# Patient Record
Sex: Female | Born: 1970 | Race: White | Hispanic: No | Marital: Married | Smoking: Never smoker
Health system: Southern US, Community
[De-identification: ages and names within clinical notes are randomized; demographics above are authoritative.]

## PROBLEM LIST (undated history)

## (undated) DIAGNOSIS — F419 Anxiety disorder, unspecified: Secondary | ICD-10-CM

## (undated) DIAGNOSIS — E7800 Pure hypercholesterolemia, unspecified: Secondary | ICD-10-CM

## (undated) DIAGNOSIS — M419 Scoliosis, unspecified: Secondary | ICD-10-CM

## (undated) DIAGNOSIS — E039 Hypothyroidism, unspecified: Secondary | ICD-10-CM

## (undated) DIAGNOSIS — F32A Depression, unspecified: Secondary | ICD-10-CM

## (undated) DIAGNOSIS — F329 Major depressive disorder, single episode, unspecified: Secondary | ICD-10-CM

## (undated) DIAGNOSIS — M549 Dorsalgia, unspecified: Secondary | ICD-10-CM

## (undated) HISTORY — PX: ABDOMINAL HYSTERECTOMY: SHX81

---

## 2009-04-29 ENCOUNTER — Emergency Department (HOSPITAL_BASED_OUTPATIENT_CLINIC_OR_DEPARTMENT_OTHER): Admission: EM | Admit: 2009-04-29 | Discharge: 2009-04-29 | Payer: Self-pay | Admitting: Emergency Medicine

## 2009-10-18 ENCOUNTER — Emergency Department (HOSPITAL_BASED_OUTPATIENT_CLINIC_OR_DEPARTMENT_OTHER): Admission: EM | Admit: 2009-10-18 | Discharge: 2009-10-19 | Payer: Self-pay | Admitting: Emergency Medicine

## 2010-11-14 LAB — URINALYSIS, ROUTINE W REFLEX MICROSCOPIC
Leukocytes, UA: NEGATIVE
Specific Gravity, Urine: 1.024 (ref 1.005–1.030)
Urobilinogen, UA: 1 mg/dL (ref 0.0–1.0)

## 2010-11-14 LAB — URINE MICROSCOPIC-ADD ON

## 2012-05-30 ENCOUNTER — Encounter (HOSPITAL_BASED_OUTPATIENT_CLINIC_OR_DEPARTMENT_OTHER): Payer: Self-pay | Admitting: *Deleted

## 2012-05-30 ENCOUNTER — Emergency Department (HOSPITAL_BASED_OUTPATIENT_CLINIC_OR_DEPARTMENT_OTHER)
Admission: EM | Admit: 2012-05-30 | Discharge: 2012-05-30 | Disposition: A | Discharge status: Home / Self Care | Payer: BC Managed Care – PPO | Attending: Emergency Medicine | Admitting: Emergency Medicine

## 2012-05-30 DIAGNOSIS — M545 Low back pain, unspecified: Secondary | ICD-10-CM | POA: Insufficient documentation

## 2012-05-30 DIAGNOSIS — R110 Nausea: Secondary | ICD-10-CM | POA: Insufficient documentation

## 2012-05-30 DIAGNOSIS — E039 Hypothyroidism, unspecified: Secondary | ICD-10-CM | POA: Insufficient documentation

## 2012-05-30 DIAGNOSIS — M4180 Other forms of scoliosis, site unspecified: Secondary | ICD-10-CM | POA: Insufficient documentation

## 2012-05-30 DIAGNOSIS — E7800 Pure hypercholesterolemia, unspecified: Secondary | ICD-10-CM | POA: Insufficient documentation

## 2012-05-30 HISTORY — DX: Scoliosis, unspecified: M41.9

## 2012-05-30 HISTORY — DX: Dorsalgia, unspecified: M54.9

## 2012-05-30 HISTORY — DX: Hypothyroidism, unspecified: E03.9

## 2012-05-30 HISTORY — DX: Pure hypercholesterolemia, unspecified: E78.00

## 2012-05-30 MED ORDER — HYDROMORPHONE HCL PF 2 MG/ML IJ SOLN
2.0000 mg | Freq: Once | INTRAMUSCULAR | Status: DC
  Filled 2012-05-30: qty 1

## 2012-05-30 MED ORDER — ONDANSETRON 8 MG PO TBDP: 8.0000 mg | ORAL_TABLET | Freq: Three times a day (TID) | ORAL | Status: DC | PRN

## 2012-05-30 MED ORDER — METHYLPREDNISOLONE 4 MG PO KIT
PACK | ORAL | Status: DC
Start: 2012-05-30 — End: 2014-12-07

## 2012-05-30 MED ORDER — HYDROMORPHONE HCL 2 MG PO TABS: 2.0000 mg | ORAL_TABLET | ORAL | Status: DC | PRN

## 2012-05-30 MED ORDER — ONDANSETRON 8 MG PO TBDP
8.0000 mg | ORAL_TABLET | Freq: Once | ORAL | Status: AC
  Administered 2012-05-30: 8 mg via ORAL
  Filled 2012-05-30: qty 1

## 2012-05-30 MED ORDER — HYDROMORPHONE HCL PF 1 MG/ML IJ SOLN
2.0000 mg | Freq: Once | INTRAMUSCULAR | Status: AC
  Administered 2012-05-30: 2 mg via INTRAMUSCULAR

## 2012-05-30 NOTE — ED Notes (Signed)
Pt. C/o of chronic history of back pain. States she was working in the yard yesterday. C/o lower back pain right greater than left that started last night. Pt. States pain worse with movement. Denies any urinary incontinence.

## 2012-05-30 NOTE — ED Provider Notes (Signed)
History     CSN: [id number]  Arrival date & time 05/30/12  0451      Chief Complaint  Patient presents with  . Back Pain    (Consider location/radiation/quality/duration/timing/severity/associated sxs/prior treatment) HPI This is a 41 year old female with a history of low back pain for about the past 6 months. She's not been on any regular pain medications. She worked in her yard yesterday and thinks she "overdid it". She's developed severe pain in the lower mid lumbar region. The pain is worse with movement, improved with lying still. Certain positions are more painful than others. Pain is exacerbated also by movement of her legs at the hips, right greater than left. She denies any bowel or bladder changes. She denies saddle anesthesia. She denies numbness or tingling. She denies motor deficit. She states the pain is causing her to be nauseated.  Past Medical History  Diagnosis Date  . Hypothyroid   . Elevated cholesterol   . Back pain   . Scoliosis     History reviewed. No pertinent past surgical history.  No family history on file.  History  Substance Use Topics  . Smoking status: Never Smoker   . Smokeless tobacco: Not on file  . Alcohol Use: No    OB History    Grav Para Term Preterm Abortions TAB SAB Ect Mult Living                  Review of Systems  All other systems reviewed and are negative.    Allergies  Review of patient's allergies indicates no known allergies.  Home Medications   Current Outpatient Rx  Name Route Sig Dispense Refill  . AMITRIPTYLINE HCL 75 MG PO TABS Oral Take by mouth at bedtime.    Marland Kitchen PROZAC PO Oral Take by mouth.    Marland Kitchen LEVOTHYROXINE SODIUM 75 MCG PO TABS Oral Take 75 mcg by mouth daily.    Marland Kitchen PANTOPRAZOLE SODIUM 40 MG PO TBEC Oral Take 40 mg by mouth daily.    Marland Kitchen ROSUVASTATIN CALCIUM 5 MG PO TABS Oral Take 5 mg by mouth daily.      BP 138/87  Pulse 91  Temp 98.3 F (36.8 C) (Oral)  Resp 20  SpO2 100%  LMP  10/29/2011  Physical Exam General: Well-developed, well-nourished female in no acute distress; appearance consistent with age of record HENT: normocephalic, atraumatic Eyes: pupils equal round and reactive to light; extraocular muscles intact Neck: supple Heart: regular rate and rhythm Lungs: clear to auscultation bilaterally Abdomen: soft; nondistended; nontender Back: No lumbar tenderness; pain on flexion or extension of lower back; pain on straight leg raise 45 left, 35 right Extremities: No deformity; decreased range of motion at hips due to pain; pulses normal Neurologic: Awake, alert and oriented; motor function intact in all extremities and symmetric; no facial droop; sensation intact and symmetric; no saddle anesthesia Skin: Warm and dry     ED Course  Procedures (including critical care time)     MDM  6:05 AM Pain improved after IM Dilaudid. We'll treat with analgesics and a steroid taper. Patient has a primary care physician with whom she can followup.        Hanley Seamen, MD 05/30/12 [phone]

## 2012-05-30 NOTE — ED Notes (Signed)
Assisted to the car in a w/c. Tolerated well.

## 2012-06-01 NOTE — ED Notes (Signed)
Pt called requesting a return to work note be mailed to home. Val, NS will mail note.

## 2014-12-07 ENCOUNTER — Emergency Department (HOSPITAL_BASED_OUTPATIENT_CLINIC_OR_DEPARTMENT_OTHER)
Admission: EM | Admit: 2014-12-07 | Discharge: 2014-12-07 | Disposition: A | Discharge status: Home / Self Care | Payer: Medicaid Other | Attending: Emergency Medicine | Admitting: Emergency Medicine

## 2014-12-07 ENCOUNTER — Encounter (HOSPITAL_BASED_OUTPATIENT_CLINIC_OR_DEPARTMENT_OTHER): Payer: Self-pay

## 2014-12-07 DIAGNOSIS — M419 Scoliosis, unspecified: Secondary | ICD-10-CM | POA: Insufficient documentation

## 2014-12-07 DIAGNOSIS — L259 Unspecified contact dermatitis, unspecified cause: Secondary | ICD-10-CM

## 2014-12-07 DIAGNOSIS — E039 Hypothyroidism, unspecified: Secondary | ICD-10-CM | POA: Insufficient documentation

## 2014-12-07 DIAGNOSIS — Z79899 Other long term (current) drug therapy: Secondary | ICD-10-CM | POA: Insufficient documentation

## 2014-12-07 MED ORDER — HYDROXYZINE HCL 25 MG PO TABS: 25.0000 mg | ORAL_TABLET | Freq: Four times a day (QID) | ORAL | Status: DC

## 2014-12-07 NOTE — ED Notes (Signed)
Rash on arms and legs that developed yesterday.  States she worked in the yard Saturday and thinks it is poison oak.  Has been using OTC benadryl PO and topical without relief.

## 2014-12-07 NOTE — ED Provider Notes (Signed)
CSN: [id number]     Arrival date & time 12/07/14  0747 History   First MD Initiated Contact with Patient 12/07/14 0759     Chief Complaint  Patient presents with  . Rash     (Consider location/radiation/quality/duration/timing/severity/associated sxs/prior Treatment) HPI Patient presents with concern of rash. She notes that since working in the garden 4 days ago she has had a rash on the right forearm, right posterior leg, left arm. Rashes minimally raised, draining clear fluid, itchy. There is no facial lesions, no fever, chills, systemic changes. Some symptomatic relief with topical medication, including Benadryl.  Past Medical History  Diagnosis Date  . Hypothyroid   . Elevated cholesterol   . Back pain   . Scoliosis    History reviewed. No pertinent past surgical history. No family history on file. History  Substance Use Topics  . Smoking status: Never Smoker   . Smokeless tobacco: Not on file  . Alcohol Use: No   OB History    No data available     Review of Systems  Constitutional: Negative for fever and chills.  Skin: Positive for rash.  Allergic/Immunologic: Negative for immunocompromised state.      Allergies  Review of patient's allergies indicates no known allergies.  Home Medications   Prior to Admission medications   Medication Sig Start Date End Date Taking? Authorizing Provider  amitriptyline (ELAVIL) 75 MG tablet Take by mouth at bedtime.    Historical Provider, MD  FLUoxetine HCl (PROZAC PO) Take by mouth.    Historical Provider, MD  hydrOXYzine (ATARAX/VISTARIL) 25 MG tablet Take 1 tablet (25 mg total) by mouth every 6 (six) hours. 12/07/14   Gerhard Munchobert Oswell Say, MD  levothyroxine (SYNTHROID, LEVOTHROID) 75 MCG tablet Take 75 mcg by mouth daily.    Historical Provider, MD  pantoprazole (PROTONIX) 40 MG tablet Take 40 mg by mouth daily.    Historical Provider, MD   BP 135/87 mmHg  Pulse 95  Temp(Src) 98.9 F (37.2 C) (Oral)  Resp 20  Ht 5\' 4"   (1.626 m)  Wt 184 lb (83.462 kg)  BMI 31.57 kg/m2  SpO2 100%  LMP 11/05/2014 (Exact Date) Physical Exam  Constitutional: She is oriented to person, place, and time. She appears well-developed and well-nourished. No distress.  HENT:  Head: Normocephalic and atraumatic.  Eyes: Conjunctivae and EOM are normal.  Cardiovascular: Normal rate and regular rhythm.   Pulmonary/Chest: Effort normal. No stridor. No respiratory distress.  Abdominal: She exhibits no distension.  Musculoskeletal: She exhibits no edema.  Neurological: She is alert and oriented to person, place, and time. No cranial nerve deficit.  Skin: Skin is warm and dry.     Psychiatric: She has a normal mood and affect.  Nursing note and vitals reviewed.   ED Course  Procedures (including critical care time)   MDM   Final diagnoses:  Contact dermatitis   well-appearing female presents with likely contact dermatitis, possibly from poison oak or ivy. No evidence for systemic illness. Patient was provided additional symptomatically relief, discharged with return precautions.  Gerhard Munchobert Santo Zahradnik, MD 12/07/14 [phone]

## 2014-12-07 NOTE — Discharge Instructions (Signed)
As discussed, with her contact dermatitis, or reaction to poison oak, it is important that you keep the wounds dry, apply the topical medication, as you have been doing, and monitor your condition carefully. Please use the provided medication for additional relief from itchiness.  Return here for any concerning changes in her condition.

## 2015-02-09 ENCOUNTER — Emergency Department (INDEPENDENT_AMBULATORY_CARE_PROVIDER_SITE_OTHER)
Admission: EM | Admit: 2015-02-09 | Discharge: 2015-02-09 | Disposition: A | Discharge status: Home / Self Care | Payer: PRIVATE HEALTH INSURANCE | Source: Home / Self Care | Attending: Family Medicine | Admitting: Family Medicine

## 2015-02-09 DIAGNOSIS — G43001 Migraine without aura, not intractable, with status migrainosus: Secondary | ICD-10-CM

## 2015-02-09 MED ORDER — PROMETHAZINE HCL 25 MG/ML IJ SOLN
25.0000 mg | Freq: Once | INTRAMUSCULAR | Status: AC
  Administered 2015-02-09: 25 mg via INTRAMUSCULAR

## 2015-02-09 MED ORDER — KETOROLAC TROMETHAMINE 60 MG/2ML IM SOLN
60.0000 mg | Freq: Once | INTRAMUSCULAR | Status: AC
  Administered 2015-02-09: 60 mg via INTRAMUSCULAR

## 2015-02-09 NOTE — Discharge Instructions (Signed)
Migraine Headache A migraine headache is an intense, throbbing pain on one or both sides of your head. A migraine can last for 30 minutes to several hours. CAUSES  The exact cause of a migraine headache is not always known. However, a migraine may be caused when nerves in the brain become irritated and release chemicals that cause inflammation. This causes pain. Certain things may also trigger migraines, such as:  Alcohol.  Smoking.  Stress.  Menstruation.  Aged cheeses.  Foods or drinks that contain nitrates, glutamate, aspartame, or tyramine.  Lack of sleep.  Chocolate.  Caffeine.  Hunger.  Physical exertion.  Fatigue.  Medicines used to treat chest pain (nitroglycerine), birth control pills, estrogen, and some blood pressure medicines. SIGNS AND SYMPTOMS  Pain on one or both sides of your head.  Pulsating or throbbing pain.  Severe pain that prevents daily activities.  Pain that is aggravated by any physical activity.  Nausea, vomiting, or both.  Dizziness.  Pain with exposure to bright lights, loud noises, or activity.  General sensitivity to bright lights, loud noises, or smells. Before you get a migraine, you may get warning signs that a migraine is coming (aura). An aura may include:  Seeing flashing lights.  Seeing bright spots, halos, or zigzag lines.  Having tunnel vision or blurred vision.  Having feelings of numbness or tingling.  Having trouble talking.  Having muscle weakness. DIAGNOSIS  A migraine headache is often diagnosed based on:  Symptoms.  Physical exam.  A CT scan or MRI of your head. These imaging tests cannot diagnose migraines, but they can help rule out other causes of headaches. TREATMENT Medicines may be given for pain and nausea. Medicines can also be given to help prevent recurrent migraines.  HOME CARE INSTRUCTIONS  Only take over-the-counter or prescription medicines for pain or discomfort as directed by your  health care provider. The use of long-term narcotics is not recommended.  Lie down in a dark, quiet room when you have a migraine.  Keep a journal to find out what may trigger your migraine headaches. For example, write down:  What you eat and drink.  How much sleep you get.  Any change to your diet or medicines.  Limit alcohol consumption.  Quit smoking if you smoke.  Get 7-9 hours of sleep, or as recommended by your health care provider.  Limit stress.  Keep lights dim if bright lights bother you and make your migraines worse. SEEK IMMEDIATE MEDICAL CARE IF:   Your migraine becomes severe.  You have a fever.  You have a stiff neck.  You have vision loss.  You have muscular weakness or loss of muscle control.  You start losing your balance or have trouble walking.  You feel faint or pass out.  You have severe symptoms that are different from your first symptoms. MAKE SURE YOU:   Understand these instructions.  Will watch your condition.  Will get help right away if you are not doing well or get worse. Document Released: 07/27/2005 Document Revised: 12/11/2013 Document Reviewed: 04/03/2013 [facility] Patient Information 2015 [place]. This information is not intended to replace advice given to you by your health care provider. Make sure you discuss any questions you have with your health care provider.  Recurrent Migraine Headache A migraine headache is very bad, throbbing pain on one or both sides of your head. Recurrent migraines keep coming back. Talk to your doctor about what things may bring on (trigger) your migraine headaches. HOME  CARE  Only take medicines as told by your doctor.  Lie down in a dark, quiet room when you have a migraine.  Keep a journal to find out if certain things bring on migraine headaches. For example, write down:  What you eat and drink.  How much sleep you get.  Any change to your diet or medicines.  Lessen how much  alcohol you drink.  Quit smoking if you smoke.  Get enough sleep.  Lessen any stress in your life.  Keep lights dim if bright lights bother you or make your migraines worse. GET HELP IF:  Medicine does not help your migraines.  Your pain keeps coming back.  You have a fever. GET HELP RIGHT AWAY IF:   Your migraine becomes really bad.  You have a stiff neck.  You have trouble seeing.  Your muscles are weak, or you lose muscle control.  You lose your balance or have trouble walking.  You feel like you will pass out (faint), or you pass out.  You have really bad symptoms that are different than your first symptoms. MAKE SURE YOU:   Understand these instructions.  Will watch your condition.  Will get help right away if you are not doing well or get worse. Document Released: 05/05/2008 Document Revised: 08/01/2013 Document Reviewed: 04/03/2013 [facility] Patient Information 2015 [place], [place]. This information is not intended to replace advice given to you by your health care provider. Make sure you discuss any questions you have with your health care provider.

## 2015-02-09 NOTE — ED Provider Notes (Signed)
CSN: [id number]     Arrival date & time 02/09/15  1048 History   First MD Initiated Contact with Patient 02/09/15 1105     Chief Complaint  Patient presents with  . Migraine   (Consider location/radiation/quality/duration/timing/severity/associated sxs/prior Treatment) HPI Pt is a 44yo female with hx of migraines presenting to UC with c/o gradually worsening headache that started on Wednesday 02/06/15.  States is started on the Right side of her neck with tension and is now all over, aching, sore, and throbbing, severe, 8/10 at this time. Associated mild photophobia, and nausea with vomiting. Reports vomiting twice since 7AM this morning and also vomited several times last night.  States she has taken her Fioricet and Amitriptyline w/o relief.  HA does feel similar to previous migraines.  Reports recent increase in stress in her life and believes that is what triggered current migraine. States she normally gets relief with Toradol and phenergan. Denies fever, chills, sick contacts or recent travel. Denies recent falls or head trauma. Denies rashes.   Past Medical History  Diagnosis Date  . Hypothyroid   . Elevated cholesterol   . Back pain   . Scoliosis    History reviewed. No pertinent past surgical history. No family history on file. History  Substance Use Topics  . Smoking status: Never Smoker   . Smokeless tobacco: Not on file  . Alcohol Use: No   OB History    No data available     Review of Systems  Constitutional: Negative for fever, chills, diaphoresis, appetite change and fatigue.  HENT: Negative for congestion, ear pain, rhinorrhea, sinus pressure, sore throat, trouble swallowing and voice change.   Eyes: Positive for photophobia. Negative for pain, redness and visual disturbance.  Respiratory: Negative for cough and shortness of breath.   Cardiovascular: Negative for chest pain and palpitations.  Gastrointestinal: Positive for nausea and vomiting. Negative for abdominal  pain, diarrhea and constipation.  Musculoskeletal: Positive for myalgias (Right upper back) and neck pain (Right side). Negative for back pain, joint swelling and neck stiffness.  Skin: Negative for rash and wound.  Neurological: Positive for headaches. Negative for dizziness, syncope, speech difficulty, weakness, light-headedness and numbness.    Allergies  Review of patient's allergies indicates no known allergies.  Home Medications   Prior to Admission medications   Medication Sig Start Date End Date Taking? Authorizing Provider  amitriptyline (ELAVIL) 75 MG tablet Take by mouth at bedtime.   Yes Historical Provider, MD  Butalbital-APAP-Caff-Cod (FIORICET/CODEINE) 50-300-40-30 MG CAPS Take by mouth as needed.   Yes Historical Provider, MD  FLUoxetine HCl (PROZAC PO) Take by mouth.   Yes Historical Provider, MD  levothyroxine (SYNTHROID, LEVOTHROID) 75 MCG tablet Take 75 mcg by mouth daily.   Yes Historical Provider, MD  pantoprazole (PROTONIX) 40 MG tablet Take 40 mg by mouth daily.   Yes Historical Provider, MD   BP 137/88 mmHg  Pulse 86  Temp(Src) 98.5 F (36.9 C)  Wt 175 lb (79.379 kg)  SpO2 98% Physical Exam  Constitutional: She is oriented to person, place, and time. She appears well-developed and well-nourished. No distress.  HENT:  Head: Normocephalic and atraumatic.  Eyes: Conjunctivae and EOM are normal. Pupils are equal, round, and reactive to light. No scleral icterus.  Neck: Normal range of motion. Neck supple. Muscular tenderness ( Right cervical muscles) present.  No nuchal rigidity or meningeal signs.  Cardiovascular: Normal rate, regular rhythm and normal heart sounds.   Pulmonary/Chest: Effort normal and breath sounds normal.  No respiratory distress. She has no wheezes. She has no rales. She exhibits no tenderness.  Abdominal: Soft. Bowel sounds are normal. She exhibits no distension and no mass. There is no tenderness. There is no rebound and no guarding.   Musculoskeletal: Normal range of motion.  Neurological: She is alert and oriented to person, place, and time. She has normal strength. No cranial nerve deficit or sensory deficit. Coordination and gait normal. GCS eye subscore is 4. GCS verbal subscore is 5. GCS motor subscore is 6.  Mental Status:  Alert, oriented, thought content appropriate. Speech fluent without evidence of aphasia. Able to follow 2 step commands without difficulty.  Cranial Nerves:  II: Peripheral visual fields grossly normal, pupils equal, round, reactive to light III,IV, VI: ptosis not present, extra-ocular motions intact bilaterally  V,VII: smile symmetric, facial light touch sensation equal VIII: hearing grossly normal bilaterally  IX,X: gag reflex present  XI: bilateral shoulder shrug equal and strong XII: midline tongue extension  Motor:  5/5 in upper and lower extremities bilaterally including strong and equal grip strength and dorsiflexion/plantar flexion Sensory: normal Deep Tendon Reflexes: 2+ and symmetric  Cerebellar: normal finger-to-nose with bilateral upper extremities Gait: normal gait and balance  Skin: Skin is warm and dry. She is not diaphoretic.  Nursing note and vitals reviewed.   ED Course  Procedures (including critical care time) Labs Review Labs Reviewed - No data to display  Imaging Review No results found.   MDM   1. Migraine without aura and with status migrainosus, not intractable     Pt presenting to UC with persistent migraine. HA is c/w prior migraines per pt.  Pain is 8/10 at this time with associated nausea and photophobia. No red flag symptoms.  Vitals: WNL. No focal neuro deficits. Pt states she normally gets better with toradol and phenergan. IM toradol  and IM phenergan  given in UC. Pt allowed to rest in darkened exam room. 11:43 AM Pt states pain has improved significantly, down from 8/10 to 3/10 at this time. Nausea has resolved. Pt states she feels  comfortable being discharged home. Return precautions provided. Pt verbalized understanding and agreement with tx plan.       Junius Finner, PA-C 02/09/15 1146

## 2015-02-09 NOTE — ED Notes (Signed)
Patient c/o migraine headache , states has had since Wednesday 02/06/15. She does believe that it is stress related. Her Fioricet and Amitriptyline has not helped

## 2015-03-16 ENCOUNTER — Encounter (HOSPITAL_BASED_OUTPATIENT_CLINIC_OR_DEPARTMENT_OTHER): Payer: Self-pay | Admitting: Emergency Medicine

## 2015-03-16 ENCOUNTER — Emergency Department (HOSPITAL_BASED_OUTPATIENT_CLINIC_OR_DEPARTMENT_OTHER)
Admission: EM | Admit: 2015-03-16 | Discharge: 2015-03-16 | Disposition: A | Discharge status: Home / Self Care | Payer: PRIVATE HEALTH INSURANCE | Attending: Emergency Medicine | Admitting: Emergency Medicine

## 2015-03-16 DIAGNOSIS — Z79899 Other long term (current) drug therapy: Secondary | ICD-10-CM | POA: Insufficient documentation

## 2015-03-16 DIAGNOSIS — M419 Scoliosis, unspecified: Secondary | ICD-10-CM | POA: Insufficient documentation

## 2015-03-16 DIAGNOSIS — E039 Hypothyroidism, unspecified: Secondary | ICD-10-CM | POA: Diagnosis not present

## 2015-03-16 DIAGNOSIS — G43909 Migraine, unspecified, not intractable, without status migrainosus: Secondary | ICD-10-CM | POA: Diagnosis not present

## 2015-03-16 DIAGNOSIS — R51 Headache: Secondary | ICD-10-CM | POA: Diagnosis present

## 2015-03-16 MED ORDER — ONDANSETRON 4 MG PO TBDP
4.0000 mg | ORAL_TABLET | Freq: Once | ORAL | Status: AC | PRN
  Administered 2015-03-16: 4 mg via ORAL
  Filled 2015-03-16: qty 1

## 2015-03-16 MED ORDER — KETOROLAC TROMETHAMINE 60 MG/2ML IM SOLN
60.0000 mg | Freq: Once | INTRAMUSCULAR | Status: AC
  Administered 2015-03-16: 60 mg via INTRAMUSCULAR
  Filled 2015-03-16: qty 2

## 2015-03-16 MED ORDER — PROMETHAZINE HCL 25 MG/ML IJ SOLN
25.0000 mg | Freq: Four times a day (QID) | INTRAMUSCULAR | Status: DC | PRN
  Filled 2015-03-16: qty 1

## 2015-03-16 MED ORDER — PROMETHAZINE HCL 25 MG/ML IJ SOLN
25.0000 mg | Freq: Once | INTRAMUSCULAR | Status: AC
  Administered 2015-03-16: 25 mg via INTRAMUSCULAR

## 2015-03-16 NOTE — ED Notes (Signed)
Pt in c/o migraine headache onset last night at 2000, associated with nausea and emesis.

## 2015-03-16 NOTE — ED Provider Notes (Signed)
CSN: [id number]   Arrival date & time 03/16/15 1551  History  This chart was scribed for  Rolland Porter, MD by Bethel Born, ED Scribe. This patient was seen in room MH12/MH12 and the patient's care was started at 6:39 PM.  Chief Complaint  Patient presents with  . Migraine  . Nausea    HPI The history is provided by the patient. No language interpreter was used.   Shannon Mclaughlin is a 43 y.o. female who presents to the Emergency Department complaining of constant headache with onset last night around 8 PM. The throbbing pain is rated 10/10 in severity and is consistent with previous migraines. No pain improvement after from Fioricet which usually resolves her migraines. In the ED IM Toradol and Benadryl usually help her pain. Associated symptoms include muscle spasms in the shoulders, nausea, and vomiting.  No fever, change in vision, neck stiffness, or weakness.   Past Medical History  Diagnosis Date  . Hypothyroid   . Elevated cholesterol   . Back pain   . Scoliosis     History reviewed. No pertinent past surgical history.  History reviewed. No pertinent family history.  History  Substance Use Topics  . Smoking status: Never Smoker   . Smokeless tobacco: Not on file  . Alcohol Use: No     Review of Systems  Constitutional: Negative for fever, chills, diaphoresis, appetite change and fatigue.  HENT: Negative for mouth sores, sore throat and trouble swallowing.   Eyes: Negative for visual disturbance.  Respiratory: Negative for cough, chest tightness, shortness of breath and wheezing.   Cardiovascular: Negative for chest pain.  Gastrointestinal: Positive for nausea and vomiting. Negative for abdominal pain, diarrhea and abdominal distention.  Endocrine: Negative for polydipsia, polyphagia and polyuria.  Genitourinary: Negative for dysuria, frequency and hematuria.  Musculoskeletal: Positive for myalgias. Negative for gait problem.  Skin: Negative for color change, pallor  and rash.  Neurological: Positive for headaches. Negative for dizziness, syncope and light-headedness.  Hematological: Does not bruise/bleed easily.  Psychiatric/Behavioral: Negative for behavioral problems and confusion.     Home Medications   Prior to Admission medications   Medication Sig Start Date End Date Taking? Authorizing Provider  amitriptyline (ELAVIL) 75 MG tablet Take by mouth at bedtime.    Historical Provider, MD  Butalbital-APAP-Caff-Cod (FIORICET/CODEINE) 50-300-40-30 MG CAPS Take by mouth as needed.    Historical Provider, MD  FLUoxetine HCl (PROZAC PO) Take by mouth.    Historical Provider, MD  levothyroxine (SYNTHROID, LEVOTHROID) 75 MCG tablet Take 75 mcg by mouth daily.    Historical Provider, MD  pantoprazole (PROTONIX) 40 MG tablet Take 40 mg by mouth daily.    Historical Provider, MD    Allergies  Review of patient's allergies indicates no known allergies.  Triage Vitals: BP 145/93 mmHg  Pulse 72  Temp(Src) 98.1 F (36.7 C) (Oral)  Resp 18  Ht  (1.651 m)  Wt 170 lb (77.111 kg)  BMI 28.29 kg/m2  SpO2 95%  LMP 03/04/2015  Physical Exam  Constitutional: She is oriented to person, place, and time. She appears well-developed and well-nourished. No distress.  HENT:  Head: Normocephalic.  Eyes: Conjunctivae are normal. Pupils are equal, round, and reactive to light. No scleral icterus.  Neck: Normal range of motion. Neck supple. No thyromegaly present.  Cardiovascular: Normal rate and regular rhythm.  Exam reveals no gallop and no friction rub.   No murmur heard. Pulmonary/Chest: Effort normal and breath sounds normal. No respiratory distress. She  has no wheezes. She has no rales.  Abdominal: Soft. Bowel sounds are normal. She exhibits no distension. There is no tenderness. There is no rebound.  Musculoskeletal: Normal range of motion.  Neurological: She is alert and oriented to person, place, and time.  Skin: Skin is warm and dry. No rash noted.   Psychiatric: She has a normal mood and affect. Her behavior is normal.    ED Course  Procedures   DIAGNOSTIC STUDIES: Oxygen Saturation is 95% on RA, normal by my interpretation.    COORDINATION OF CARE: 6:43 PM Discussed treatment plan which includes Phenergan, Toradol, and Zofran with pt at bedside and pt agreed to plan.  Labs Review- Labs Reviewed - No data to display  Imaging Review No results found.  EKG Interpretation None      MDM   Final diagnoses:  Migraine without status migrainosus, not intractable, unspecified migraine type    Patient given IV medications. Improvement of symptoms. No red flag symptoms or findings. Appropriate for outpatient treatment.   I personally performed the services described in this documentation, which was scribed in my presence. The recorded information has been reviewed and is accurate.     Rolland Porter, MD 03/16/15 [phone]

## 2015-03-16 NOTE — Discharge Instructions (Signed)

## 2016-12-25 ENCOUNTER — Emergency Department (HOSPITAL_BASED_OUTPATIENT_CLINIC_OR_DEPARTMENT_OTHER): Payer: Worker's Compensation

## 2016-12-25 ENCOUNTER — Emergency Department (HOSPITAL_BASED_OUTPATIENT_CLINIC_OR_DEPARTMENT_OTHER)
Admission: EM | Admit: 2016-12-25 | Discharge: 2016-12-25 | Disposition: A | Discharge status: Home / Self Care | Payer: Worker's Compensation | Attending: Emergency Medicine | Admitting: Emergency Medicine

## 2016-12-25 ENCOUNTER — Encounter (HOSPITAL_BASED_OUTPATIENT_CLINIC_OR_DEPARTMENT_OTHER): Payer: Self-pay | Admitting: Emergency Medicine

## 2016-12-25 DIAGNOSIS — S61211A Laceration without foreign body of left index finger without damage to nail, initial encounter: Secondary | ICD-10-CM | POA: Diagnosis not present

## 2016-12-25 DIAGNOSIS — Y939 Activity, unspecified: Secondary | ICD-10-CM | POA: Diagnosis not present

## 2016-12-25 DIAGNOSIS — W268XXA Contact with other sharp object(s), not elsewhere classified, initial encounter: Secondary | ICD-10-CM | POA: Insufficient documentation

## 2016-12-25 DIAGNOSIS — Y990 Civilian activity done for income or pay: Secondary | ICD-10-CM | POA: Insufficient documentation

## 2016-12-25 DIAGNOSIS — Z79899 Other long term (current) drug therapy: Secondary | ICD-10-CM | POA: Diagnosis not present

## 2016-12-25 DIAGNOSIS — Y929 Unspecified place or not applicable: Secondary | ICD-10-CM | POA: Diagnosis not present

## 2016-12-25 DIAGNOSIS — S6990XA Unspecified injury of unspecified wrist, hand and finger(s), initial encounter: Secondary | ICD-10-CM | POA: Diagnosis present

## 2016-12-25 HISTORY — DX: Depression, unspecified: F32.A

## 2016-12-25 HISTORY — DX: Major depressive disorder, single episode, unspecified: F32.9

## 2016-12-25 MED ORDER — LIDOCAINE HCL 2 % IJ SOLN
10.0000 mL | Freq: Once | INTRAMUSCULAR | Status: AC
  Administered 2016-12-25: 200 mg
  Filled 2016-12-25: qty 20

## 2016-12-25 NOTE — ED Triage Notes (Addendum)
Laceration to L index finger from a can. Pt sent from UC for stitches

## 2016-12-25 NOTE — Discharge Instructions (Signed)
Please read instructions below.  Keep your wound clean, dry, and covered. Keep your finger in a splint until your follow up appointment. Follow up with the hand specialist for wound recheck in 7 days.  You can take tylenol or advil as needed for pain. Return to the ER for fever, pus draining from wound, redness, or new or worsening symptoms.

## 2016-12-25 NOTE — ED Provider Notes (Signed)
[facility] Note   CSN: [id number] Arrival date & time: 12/25/16  1620   By signing my name below, I, Clarisse Gouge, attest that this documentation has been prepared under the direction and in the presence of [place] R Russo, PA-C. Electronically Signed: Clarisse Gouge, Scribe. 12/25/16. 5:47 PM.   History   Chief Complaint Chief Complaint  Patient presents with  . Laceration   The history is provided by the patient, medical records and the spouse. No language interpreter was used.    Shannon Mclaughlin is a 46 y.o. female with h/o hypothyroid disorder and elevated cholesterol who presents to the Emergency Department with concern for L index wound sustained at work earlier today. Pt cut her finger while opening a can at work; she adds bruising to the same finger s/p hitting it on a car seat earlier today. No fingernail involvement noted with laceration. She notes mild, throbbing, non radiating pain that is constant, decreased from 3-4/10 pain earlier, worse with applied pressure and contact. No other modifying factors noted. Pt seen in [place] primary PTA and sent to [facility] ED d/t profuse nature of bleeding for laceration repair. Bleeding controlled with gauze wrap. Tetanus up-to-date as of April 2018. Pt taking prescribed medications for thyroid disorder, presribed fluoxetine and amitriptyline at home regularly. 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 No allergies, numbness, blood thinners or h/o DM. No other complaints at this time. Last tetanus 11/08/2016.  Past Medical History:  Diagnosis Date  . Back pain   . Depression   . Elevated cholesterol   . Hypothyroid   . Scoliosis     There are no active problems to display for this patient.   History reviewed. No pertinent surgical history.  OB History    No data available       Home Medications    Prior to Admission medications   Medication Sig Start Date End Date Taking? Authorizing Provider    amitriptyline (ELAVIL) 75 MG tablet Take by mouth at bedtime.   Yes [provider]  FLUoxetine HCl (PROZAC PO) Take by mouth.   Yes [provider]  levothyroxine (SYNTHROID, LEVOTHROID) 75 MCG tablet Take 75 mcg by mouth daily.   Yes [provider]  Butalbital-APAP-Caff-Cod (FIORICET/CODEINE) 50-300-40-30 MG CAPS Take by mouth as needed.    [provider]  pantoprazole (PROTONIX) 40 MG tablet Take 40 mg by mouth daily.    [provider]    Family History No family history on file.  Social History Social History  Substance Use Topics  . Smoking status: Never Smoker  . Smokeless tobacco: Former Neurosurgeon  . Alcohol use No     Allergies   Patient has no known allergies.   Review of Systems Review of Systems  Skin: Positive for wound.  Allergic/Immunologic: Negative for immunocompromised state.  Neurological: Negative for weakness and numbness.  Hematological: Does not bruise/bleed easily.     Physical Exam Updated Vital Signs BP (!) 138/92 (BP Location: Right Arm)   Pulse 90   Temp 98.7 F (37.1 C) (Oral)   Resp 18   Ht 5\' 4"  (1.626 m)   Wt 155 lb (70.3 kg)   LMP 12/15/2016   SpO2 100%   BMI 26.61 kg/m   Physical Exam  Constitutional: She appears well-developed and well-nourished.  HENT:  Head: Normocephalic and atraumatic.  Eyes: Conjunctivae are normal.  Cardiovascular: Normal rate.   Pulmonary/Chest: Effort normal.  Musculoskeletal: Normal range of motion.  NL  active extension and flexion of finger L index finger.  Neurological: No sensory deficit.  Sensation intact to L index finger  Skin: Laceration noted.  L index: transverse laceration distal palmar aspect, beginning center of finger pad and extends around medial surface. No finger nail involvement.  Ecchymosis noted over dorsal aspect of middle interphalangeal joint.   Psychiatric: She has a normal mood and affect. Her behavior is normal.  Nursing note  and vitals reviewed.    ED Treatments / Results  DIAGNOSTIC STUDIES: Oxygen Saturation is 100% on RA, NL by my interpretation.    COORDINATION OF CARE: 5:33 PM-Discussed next steps with pt. Pt verbalized understanding and is agreeable with the plan. Will order imaging and prepare for laceration repair.   Labs (all labs ordered are listed, but only abnormal results are displayed) Labs Reviewed - No data to display  EKG  EKG Interpretation None       Radiology Dg Hand Complete Left  Result Date: 12/25/2016 CLINICAL DATA:  concern for L index wound sustained at work > 1 hour PTA. Pt cut her finger while opening a can at work; she adds bruising to the same finger s/p hitting it on a car seat earlier today. No fingernail involvement noted with laceration. Pt seen in [place] primary PTA and sent to [facility] ED d/t profuse nature of bleeding for laceration repair. Bleeding controlled with gauze wrap; finger immobilizer in place. She notes mild, throbbing pain that is constant, decreased from 3-4/10 pain earlier, worse with applied pressure and contact. EXAM: LEFT HAND - COMPLETE 3+ VIEW COMPARISON:  None. FINDINGS: No fracture.  No bone lesion. The joints are normally spaced and aligned. No radiopaque foreign body. IMPRESSION: No fracture, dislocation or radiopaque foreign body. Electronically Signed   By: Amie Portland M.D.   On: 12/25/2016 18:01    Procedures .Marland KitchenLaceration Repair Date/Time: 12/25/2016 6:22 PM Performed by: RUSSO, [place] N Authorized by: RUSSO, [place] N   Consent:    Consent obtained:  Verbal   Consent given by:  Patient   Risks discussed:  Pain, poor cosmetic result, poor wound healing and infection Anesthesia (see MAR for exact dosages):    Anesthesia method:  Nerve block   Block location:  Base of index finger   Block needle gauge:  25 G   Block anesthetic:  Lidocaine 2% w/o epi   Block injection procedure:  Anatomic landmarks palpated and negative aspiration for  blood   Block outcome:  Anesthesia achieved Laceration details:    Location:  Finger   Finger location:  L index finger   Length (cm):  1.5 Repair type:    Repair type:  Simple Pre-procedure details:    Preparation:  Patient was prepped and draped in usual sterile fashion Exploration:    Hemostasis achieved with:  Direct pressure and tourniquet   Wound extent: no foreign bodies/material noted, no muscle damage noted, no nerve damage noted, no tendon damage noted and no underlying fracture noted     Contaminated: no   Treatment:    Area cleansed with:  Betadine   Amount of cleaning:  Standard   Irrigation solution:  Sterile saline   Irrigation method:  Syringe   Visualized foreign bodies/material removed: no   Skin repair:    Repair method:  Sutures   Suture size:  5-0   Wound skin closure material used: vicryl.   Suture technique:  Simple interrupted   Number of sutures:  5 Approximation:    Approximation:  Close  Vermilion border: well-aligned   Post-procedure details:    Dressing:  Sterile dressing and splint for protection (and finger cot)   Patient tolerance of procedure:  Tolerated well, no immediate complications     (including critical care time)  Medications Ordered in ED Medications  lidocaine (XYLOCAINE) 2 % (with pres) injection 200 mg (200 mg Infiltration Given 12/25/16 1754)     Initial Impression / Assessment and Plan / ED Course  I have reviewed the triage vital signs and the nursing notes.  Pertinent labs & imaging results that were available during my care of the patient were reviewed by me and considered in my medical decision making (see chart for details).     Patient with laceration and ecchymosis to left index finger. Xray without acute fracture. Pressure irrigation performed. Wound explored and base of wound visualized in a bloodless field without evidence of foreign body.  Laceration occurred < 8 hours prior to repair which was well tolerated.  Tdap up-to-date.  Pt has no comorbidities to effect normal wound healing. Pt discharged without antibiotics.  Discussed suture home care with patient and answered questions. Pt to follow-up with hand specialist for wound check and suture removal in 7 days; they are to return to the ED sooner for signs of infection. Pt is hemodynamically stable with no complaints prior to dc.    Patient discussed with Dr. Donnald GarrePfeiffer.  Discussed results, findings, treatment and follow up. Patient advised of return precautions. Patient verbalized understanding and agreed with plan.    Final Clinical Impressions(s) / ED Diagnoses   Final diagnoses:  Laceration of left index finger w/o foreign body w/o damage to nail, initial encounter    New Prescriptions New Prescriptions   No medications on file  I personally performed the services described in this documentation, which was scribed in my presence. The recorded information has been reviewed and is accurate.    Russo, [place] N, PA-C 12/25/16 1933    Arby BarrettePfeiffer, Marcy, MD 12/26/16 [phone]

## 2016-12-25 NOTE — ED Notes (Signed)
Pt verbalizes understanding of d/c instructions and denies any further needs at this time. 

## 2019-05-25 ENCOUNTER — Other Ambulatory Visit: Payer: Self-pay

## 2019-05-25 ENCOUNTER — Emergency Department (HOSPITAL_BASED_OUTPATIENT_CLINIC_OR_DEPARTMENT_OTHER): Payer: No Typology Code available for payment source

## 2019-05-25 ENCOUNTER — Emergency Department (HOSPITAL_BASED_OUTPATIENT_CLINIC_OR_DEPARTMENT_OTHER)
Admission: EM | Admit: 2019-05-25 | Discharge: 2019-05-25 | Disposition: A | Discharge status: Home / Self Care | Payer: No Typology Code available for payment source | Attending: Emergency Medicine | Admitting: Emergency Medicine

## 2019-05-25 ENCOUNTER — Encounter (HOSPITAL_BASED_OUTPATIENT_CLINIC_OR_DEPARTMENT_OTHER): Payer: Self-pay

## 2019-05-25 DIAGNOSIS — Y990 Civilian activity done for income or pay: Secondary | ICD-10-CM | POA: Insufficient documentation

## 2019-05-25 DIAGNOSIS — Y929 Unspecified place or not applicable: Secondary | ICD-10-CM | POA: Diagnosis not present

## 2019-05-25 DIAGNOSIS — S61213A Laceration without foreign body of left middle finger without damage to nail, initial encounter: Secondary | ICD-10-CM | POA: Insufficient documentation

## 2019-05-25 DIAGNOSIS — E782 Mixed hyperlipidemia: Secondary | ICD-10-CM | POA: Insufficient documentation

## 2019-05-25 DIAGNOSIS — W260XXA Contact with knife, initial encounter: Secondary | ICD-10-CM | POA: Insufficient documentation

## 2019-05-25 DIAGNOSIS — Z79899 Other long term (current) drug therapy: Secondary | ICD-10-CM | POA: Diagnosis not present

## 2019-05-25 DIAGNOSIS — Y9389 Activity, other specified: Secondary | ICD-10-CM | POA: Insufficient documentation

## 2019-05-25 DIAGNOSIS — M79644 Pain in right finger(s): Secondary | ICD-10-CM

## 2019-05-25 DIAGNOSIS — E039 Hypothyroidism, unspecified: Secondary | ICD-10-CM | POA: Insufficient documentation

## 2019-05-25 HISTORY — DX: Anxiety disorder, unspecified: F41.9

## 2019-05-25 MED ORDER — LIDOCAINE HCL (PF) 1 % IJ SOLN
5.0000 mL | Freq: Once | INTRAMUSCULAR | Status: AC
  Administered 2019-05-25: 5 mL
  Filled 2019-05-25: qty 5

## 2019-05-25 NOTE — ED Notes (Signed)
Patient transported to X-ray 

## 2019-05-25 NOTE — ED Provider Notes (Signed)
[place] EMERGENCY DEPARTMENT Provider Note   CSN: [id number] Arrival date & time: 05/25/19  1544     History   Chief Complaint Chief Complaint  Patient presents with  . Finger Injury    HPI Shannon Mclaughlin is a 48 y.o. female presents today for laceration of the left middle finger that occurred at work 2-3 hours ago.  She reports she was opening a box with a brand-new box cutter when she slipped and lacerated the dorsal aspect of her left middle finger along the PIP.  She reports pain at this time as a mild throbbing aching sensation constant worsened with palpation and improved with rest, nonradiating.  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  Patient denies history of fever/chills, chest pain/shortness of breath, numbness/weakness, tingling or any additional injuries today.  Reports Tdap up-to-date within the last 2 years.    HPI  Past Medical History:  Diagnosis Date  . Anxiety   . Back pain   . Depression   . Elevated cholesterol   . Hypothyroid   . Scoliosis     There are no active problems to display for this patient.   Past Surgical History:  Procedure Laterality Date  . ABDOMINAL HYSTERECTOMY       OB History   No obstetric history on file.      Home Medications    Prior to Admission medications   Medication Sig Start Date End Date Taking? Authorizing Provider  amitriptyline (ELAVIL) 75 MG tablet Take by mouth at bedtime.    [provider]  Butalbital-APAP-Caff-Cod (FIORICET/CODEINE) 50-300-40-30 MG CAPS Take by mouth as needed.    [provider]  FLUoxetine HCl (PROZAC PO) Take by mouth.    [provider]  levothyroxine (SYNTHROID, LEVOTHROID) 75 MCG tablet Take 75 mcg by mouth daily.    [provider]  pantoprazole (PROTONIX) 40 MG tablet Take 40 mg by mouth daily.     [provider]    Family History No family history on file.  Social History Social History   Tobacco Use  . Smoking status: Never Smoker  . Smokeless tobacco: Former Network engineer Use Topics  . Alcohol use: No  . Drug use: No     Allergies   Patient has no known allergies.   Review of Systems Review of Systems Ten systems are reviewed and are negative for acute change except as noted in the HPI   Physical Exam Updated Vital Signs BP (!) 133/96 (BP Location: Right Arm)   Pulse 87   Temp 99.6 F (37.6 C) (Oral)   Resp 18   Ht 5\' 3"  (1.6 m)   Wt 72.6 kg   LMP 12/15/2016   SpO2 100%   BMI 28.34 kg/m   Physical Exam Constitutional:      General: She is not in acute distress.    Appearance: Normal appearance. She is well-developed. She is not ill-appearing or diaphoretic.  HENT:     Head: Normocephalic and atraumatic.     Right Ear: External ear normal.     Left Ear: External ear normal.     Nose: Nose normal.  Eyes:     General: Vision grossly intact. Gaze aligned appropriately.     Pupils: Pupils are equal, round, and reactive to light.  Neck:     Musculoskeletal: Normal range of motion.  Trachea: Trachea and phonation normal. No tracheal deviation.  Pulmonary:     Effort: Pulmonary effort is normal. No respiratory distress.  Abdominal:     General: There is no distension.     Palpations: Abdomen is soft.     Tenderness: There is no abdominal tenderness. There is no guarding or rebound.  Musculoskeletal: Normal range of motion.       Hands:     Comments: Left hand: 2.5 cm laceration of the dorsal left middle finger extending from the proximal phalanx across the PIP to the middle phalanx.  No other injuries seen.  Minimal tenderness to palpation. No snuffbox tenderness to palpation. No tenderness to palpation over flexor sheath. Finger adduction/abduction intact with 5/5 strength.  Thumb opposition intact. Full active and resisted ROM to  flexion/extension at wrist, MCP, PIP and DIP of all fingers.  FDS/FDP intact. Grip 5/5 strength.  Radial artery 2+ with <2sec cap refill in all fingers.  Sensation intact to light-tough in median/ulnar/radial distributions.  Skin:    General: Skin is warm and dry.  Neurological:     Mental Status: She is alert.     GCS: GCS eye subscore is 4. GCS verbal subscore is 5. GCS motor subscore is 6.     Comments: Speech is clear and goal oriented, follows commands Major Cranial nerves without deficit, no facial droop Moves extremities without ataxia, coordination intact  Psychiatric:        Behavior: Behavior normal.      ED Treatments / Results  Labs (all labs ordered are listed, but only abnormal results are displayed) Labs Reviewed - No data to display  EKG None  Radiology Dg Finger Ring Left  Result Date: 05/25/2019 CLINICAL DATA:  Laceration to proximal interphalangeal joint at work. EXAM: LEFT RING FINGER 2+V COMPARISON:  12/25/2016 FINDINGS: No signs of fracture or foreign body in the ring finger with dressing over the midportion of the finger overlying the proximal and distal interphalangeal joints. Laceration is seen potentially along volar surface of the hand. IMPRESSION: Laceration without signs of fracture or radiopaque foreign body Electronically Signed   By: Donzetta Kohut M.D.   On: 05/25/2019 17:08    Procedures .Marland KitchenLaceration Repair  Date/Time: 05/25/2019 6:58 PM Performed by: Bill Salinas, PA-C Authorized by: Bill Salinas, PA-C   Consent:    Consent obtained:  Verbal   Consent given by:  Patient   Risks discussed:  Infection, need for additional repair, nerve damage, poor cosmetic result, poor wound healing, pain, retained foreign body, tendon damage and vascular damage Anesthesia (see MAR for exact dosages):    Anesthesia method:  Local infiltration   Local anesthetic:  Lidocaine 1% w/o epi Laceration details:    Location:  Finger   Finger  location:  L long finger   Length (cm):  2.5   Depth (mm):  3 Repair type:    Repair type:  Intermediate Pre-procedure details:    Preparation:  Patient was prepped and draped in usual sterile fashion and imaging obtained to evaluate for foreign bodies Exploration:    Wound exploration: wound explored through full range of motion and entire depth of wound probed and visualized     Wound extent: no foreign bodies/material noted, no muscle damage noted, no nerve damage noted, no tendon damage noted, no underlying fracture noted and no vascular damage noted   Treatment:    Area cleansed with:  Shur-Clens and saline   Amount of cleaning:  Standard  Irrigation solution:  Sterile saline   Irrigation method:  Pressure wash Skin repair:    Repair method:  Sutures   Suture size:  4-0   Suture material:  Prolene   Suture technique:  Simple interrupted   Number of sutures:  5 Approximation:    Approximation:  Close Post-procedure details:    Dressing:  Antibiotic ointment, non-adherent dressing and sterile dressing   Patient tolerance of procedure:  Tolerated well, no immediate complications Comments:     Wound exploration without evidence of tendon injury.   (including critical care time)  Medications Ordered in ED Medications  lidocaine (PF) (XYLOCAINE) 1 % injection 5 mL (5 mLs Infiltration Given 05/25/19 1632)     Initial Impression / Assessment and Plan / ED Course  I have reviewed the triage vital signs and the nursing notes.  Pertinent labs & imaging results that were available during my care of the patient were reviewed by me and considered in my medical decision making (see chart for details).  Clinical Course as of May 25 1907  Thu May 25, 2019  4540 Dr. Merlyn Lot; clean, repair, splint, f/u in office.   [BM]    Clinical Course User Index [BM] Bill Salinas, PA-C   Shannon Mclaughlin is a 48 y.o. female who presents to ED for laceration of the left middle finger  that occurred 2-3 hours prior to arrival.  Laceration of the dorsal middle finger extending from just below the PIP almost to the DIP.  Tdap up-to-date within 2 years per patient.  DG left middle finger:  IMPRESSION:  Laceration without signs of fracture or radiopaque foreign body   Discussed case and reviewed imaging with on-call hand surgeon Dr. Merlyn Lot who advises to clean and repair finger here, placed patient in a splint and have her follow-up with his office as outpatient.  Patient is to call tomorrow morning to schedule that appointment. - Wound thoroughly cleaned in ED today. Wound explored and bottom of wound seen in a bloodless field. Laceration repaired as dictated above.  Thorough wound exploration is without sign of tendon injury.  Wound thoroughly cleaned and without sign of contamination today, no antibiotics indicated at this time, patient is to follow-up with Dr. Merrilee Seashore office in the next few days for follow-up and wound recheck at that time, she is to return to the ER immediately if signs of infection occur. - Post procedure capillary refill and sensation is intact.  She has full strength and range of motion with flexion and extension of all joints without pain.  Dressed by nursing staff, placed in splint.  Patient counseled on home wound care. Follow up with PCP/urgent care or return to ER for suture removal in 7 days. Patient was urged to return to the Emergency Department for worsening pain, swelling, expanding erythema especially if it streaks away from the affected area, fever, or for any additional concerns.  At this time there does not appear to be any evidence of an acute emergency medical condition and the patient appears stable for discharge with appropriate outpatient follow up. Diagnosis was discussed with patient who verbalizes understanding of care plan and is agreeable to discharge. I have discussed return precautions with patient who verbalizes understanding of return  precautions. Patient encouraged to follow-up with their PCP and Hand. All questions answered.   Note: Portions of this report may have been transcribed using voice recognition software. Every effort was made to ensure accuracy; however, inadvertent computerized transcription errors may still  be present. Final Clinical Impressions(s) / ED Diagnoses   Final diagnoses:  Laceration of left middle finger without foreign body without damage to nail, initial encounter    ED Discharge Orders    None       Elizabeth PalauMorelli, Liston Thum A, PA-C 05/25/19 1909    Vanetta MuldersZackowski, Scott, MD 05/28/19 1642

## 2019-05-25 NOTE — ED Notes (Signed)
ED Provider at bedside. 

## 2019-05-25 NOTE — ED Notes (Signed)
Pt reports cutting left ring finger at work with box cutter today. States that she was seen at [place] who told her "you may have cut a tendon". Area is wrapped in gauze, bleeding controlled at this time.

## 2019-05-25 NOTE — ED Triage Notes (Signed)
Pt has a lac to her ring finger on the L hand from a box cutter. Wound is bandage and bleeding is controlled at this time.

## 2019-05-25 NOTE — Discharge Instructions (Signed)
You have been diagnosed today with laceration of the left middle finger.  At this time there does not appear to be the presence of an emergent medical condition, however there is always the potential for conditions to change. Please read and follow the below instructions.  Please return to the Emergency Department immediately for any new or worsening symptoms. Please be sure to follow up with your Primary Care Provider within one week regarding your visit today; please call their office to schedule an appointment even if you are feeling better for a follow-up visit. Please call Dr. Levell July office tomorrow morning to schedule a follow-up.  He would like to see you in his office either tomorrow or early next week for recheck.  Please change your wound dressing twice daily and monitor for signs of infection.  Return to the ER immediately if signs of infection occur.  Continue using your splint to protect your sutures and laceration.  Get help right away if: You have very bad swelling around your wound. You have pus or a bad smell coming from your wound. Your pain suddenly gets worse and is very bad. You have painful lumps near your wound or anywhere on your body. You have fever or chills You have a red streak going away from your wound. The wound is on your hand or foot, and: You cannot move a finger or toe as you used to do. Your fingers or toes look pale or blue. You have numbness that spreads down your hand, foot, fingers, or toes. You have any new/concerning or worsening symptoms  Please read the additional information packets attached to your discharge summary.  Do not take your medicine if  develop an itchy rash, swelling in your mouth or lips, or difficulty breathing; call 911 and seek immediate emergency medical attention if this occurs.  Note: Portions of this text may have been transcribed using voice recognition software. Every effort was made to ensure accuracy; however, inadvertent  computerized transcription errors may still be present.

## 2020-06-07 IMAGING — DX DG FINGER RING 2+V*L*
3 series · 3 of 3 positions shown · non-contrast
Comparison: 12/25/2016

CLINICAL DATA: Laceration to proximal interphalangeal joint at
work.

EXAM:
LEFT RING FINGER 2+V

[finger ap]
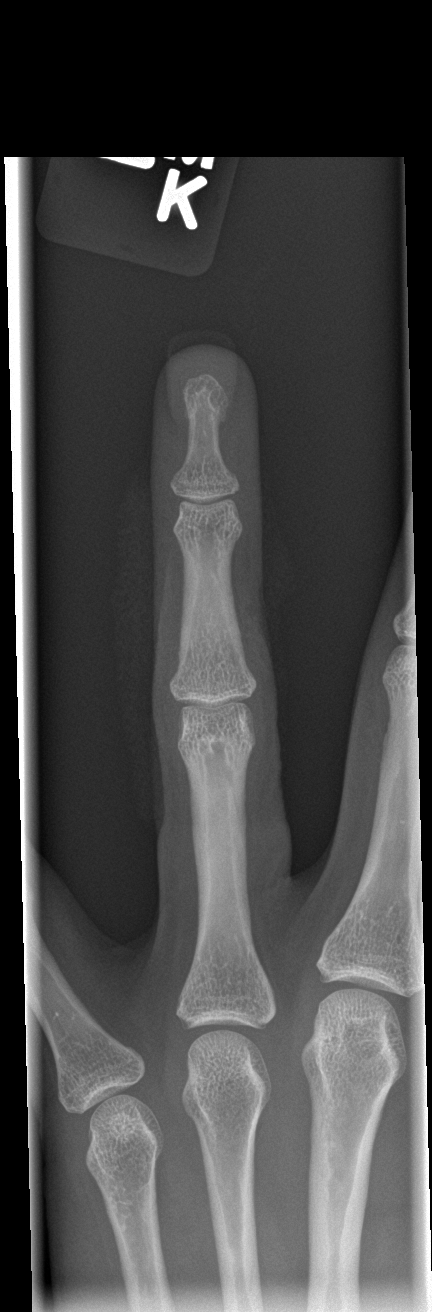

[finger obl]
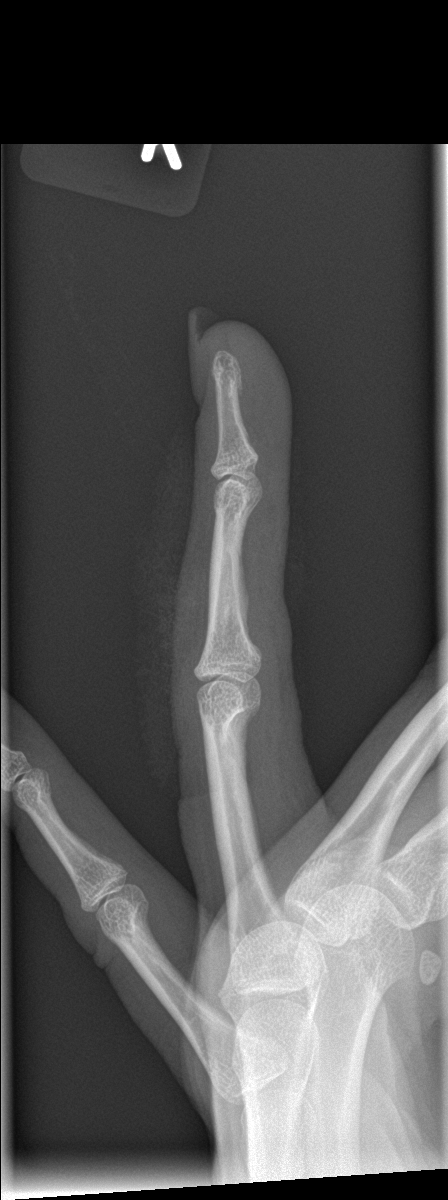

[finger lat]
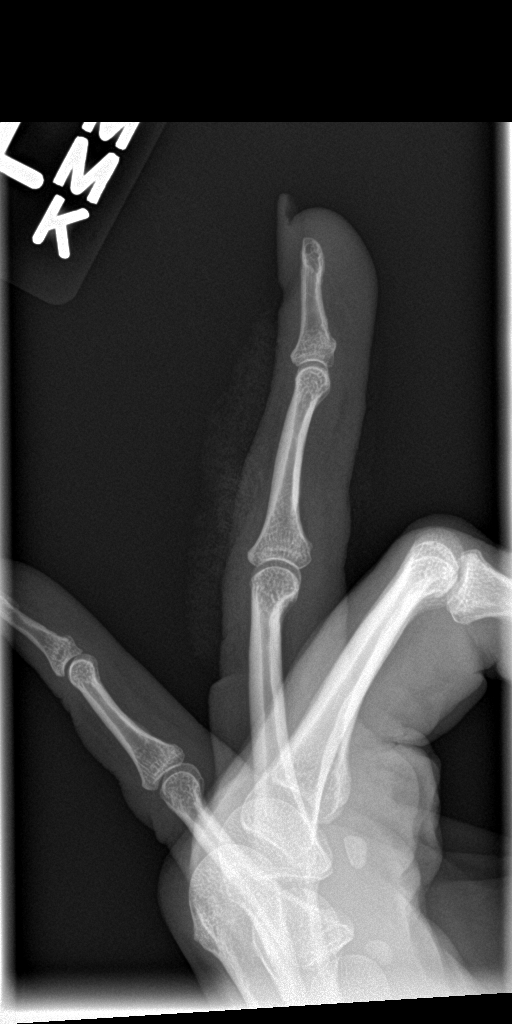

[3 of 3 positions shown; findings below may reference images not displayed]

FINDINGS: No signs of fracture or foreign body in the ring finger with
dressing over the midportion of the finger overlying the proximal
and distal interphalangeal joints. Laceration is seen potentially
along volar surface of the hand.
IMPRESSION: Laceration without signs of fracture or radiopaque foreign body
